# Patient Record
Sex: Male | Born: 1980 | Race: Black or African American | Hispanic: No | Marital: Single | Smoking: Current every day smoker
Health system: Southern US, Community
[De-identification: ages and names within clinical notes are randomized; demographics above are authoritative.]

---

## 2017-09-29 ENCOUNTER — Encounter (HOSPITAL_COMMUNITY): Payer: Self-pay | Admitting: Emergency Medicine

## 2017-09-29 ENCOUNTER — Ambulatory Visit (HOSPITAL_COMMUNITY)
Admission: EM | Admit: 2017-09-29 | Discharge: 2017-09-29 | Disposition: A | Discharge status: Home / Self Care | Payer: Self-pay | Attending: Family Medicine | Admitting: Family Medicine

## 2017-09-29 ENCOUNTER — Ambulatory Visit (INDEPENDENT_AMBULATORY_CARE_PROVIDER_SITE_OTHER): Payer: Self-pay

## 2017-09-29 DIAGNOSIS — M79642 Pain in left hand: Secondary | ICD-10-CM

## 2017-09-29 DIAGNOSIS — S63653A Sprain of metacarpophalangeal joint of left middle finger, initial encounter: Secondary | ICD-10-CM

## 2017-09-29 DIAGNOSIS — X500XXA Overexertion from strenuous movement or load, initial encounter: Secondary | ICD-10-CM

## 2017-09-29 MED ORDER — DICLOFENAC SODIUM 75 MG PO TBEC: 75.0000 mg | DELAYED_RELEASE_TABLET | Freq: Two times a day (BID) | ORAL | 0 refills | Status: DC

## 2017-09-29 NOTE — ED Provider Notes (Signed)
[facility]    CSN: [id number] Arrival date & time: 09/29/17  1457     History   Chief Complaint Chief Complaint  Patient presents with  . Hand Pain    HPI Berenda MoraleWendell Neil Gatchell is a 37 y.o. male.   Pt states he was lifting something with his L hand this morning, states "it was maybe 8 pounds at the most". States he felt a pop in his hand and now its painful to bend his fingers or to make a fist.   Patient works as a Midwifebutcher.  Pain is located over the M CP joint of his dorsal middle finger on the left.     History reviewed. No pertinent past medical history.  There are no active problems to display for this patient.   History reviewed. No pertinent surgical history.     Home Medications    Prior to Admission medications   Medication Sig Start Date End Date Taking? Authorizing Provider  diclofenac (VOLTAREN) 75 MG EC tablet Take 1 tablet (75 mg total) by mouth 2 (two) times daily. 09/29/17   Elvina SidleLauenstein, Kasiyah Platter, MD    Family History Family History  Problem Relation Age of Onset  . Healthy Mother   . Healthy Father     Social History Social History   Tobacco Use  . Smoking status: Current Every Day Smoker  Substance Use Topics  . Alcohol use: Yes  . Drug use: Never     Allergies   Patient has no known allergies.   Review of Systems Review of Systems  Constitutional: Negative.   HENT: Negative.   Respiratory: Negative.   Musculoskeletal: Negative for joint swelling.  Neurological: Negative.   Psychiatric/Behavioral: Negative.      Physical Exam Triage Vital Signs ED Triage Vitals [09/29/17 1515]  Enc Vitals Group     BP 109/73     Pulse Rate 65     Resp 18     Temp (!) 97.5 F (36.4 C)     Temp Source Oral     SpO2 99 %     Weight      Height      Head Circumference      Peak Flow      Pain Score      Pain Loc      Pain Edu?      Excl. in GC?    No data found.  Updated Vital Signs BP 109/73   Pulse 65   Temp (!)  97.5 F (36.4 C) (Oral)   Resp 18   SpO2 99%   Visual Acuity Right Eye Distance:   Left Eye Distance:   Bilateral Distance:    Right Eye Near:   Left Eye Near:    Bilateral Near:     Physical Exam  Constitutional: He is oriented to person, place, and time. He appears well-developed and well-nourished.  HENT:  Head: Normocephalic and atraumatic.  Right Ear: External ear normal.  Left Ear: External ear normal.  Eyes: Pupils are equal, round, and reactive to light. Conjunctivae are normal.  Neck: Normal range of motion. Neck supple.  Pulmonary/Chest: Effort normal.  Musculoskeletal:  Able to make fist slowly with left hand.  Tender over dorsal MCP joint of left third finger.  No swelling.  Right handed  Left wrist and other fingers are normal re:  ROM and appearance  Neurological: He is alert and oriented to person, place, and time. No sensory deficit.  Skin: Skin is  warm and dry.  Nursing note and vitals reviewed.    UC Treatments / Results  Labs (all labs ordered are listed, but only abnormal results are displayed) Labs Reviewed - No data to display  EKG None  Radiology Dg Hand Complete Left  Result Date: 09/29/2017 CLINICAL DATA:  Sharp pain in the left hand EXAM: LEFT HAND - COMPLETE 3+ VIEW COMPARISON:  None. FINDINGS: There is no evidence of fracture or dislocation. There is no evidence of arthropathy or other focal bone abnormality. Soft tissues are unremarkable. IMPRESSION: Negative. Electronically Signed   By: Jasmine PangKim  Fujinaga M.D.   On: 09/29/2017 15:57    Procedures Procedures (including critical care time)  Medications Ordered in UC Medications - No data to display  Initial Impression / Assessment and Plan / UC Course  I have reviewed the triage vital signs and the nursing notes.  Pertinent labs & imaging results that were available during my care of the patient were reviewed by me and considered in my medical decision making (see chart for  details).     Final Clinical Impressions(s) / UC Diagnoses   Final diagnoses:  Sprain of metacarpophalangeal (MCP) joint of left middle finger, initial encounter     Discharge Instructions     There is no evidence of fracture or dislocation.    ED Prescriptions    Medication Sig Dispense Auth. Provider   diclofenac (VOLTAREN) 75 MG EC tablet Take 1 tablet (75 mg total) by mouth 2 (two) times daily. 14 tablet Elvina SidleLauenstein, Roberta Kelly, MD     Controlled Substance Prescriptions [place] Controlled Substance Registry consulted? Not Applicable   Elvina SidleLauenstein, Fanchon Papania, MD 09/29/17 [id number]

## 2017-09-29 NOTE — ED Triage Notes (Signed)
Pt states he was lifting something with his L hand this morning, states "it was maybe 8 pounds at the most". States he felt a pop in his hand and now its painful to bend his fingers or to make a fist.

## 2017-09-29 NOTE — Discharge Instructions (Addendum)
There is no evidence of fracture or dislocation.  Keep finger taped to the ring finger for a week.

## 2018-03-03 ENCOUNTER — Ambulatory Visit (HOSPITAL_COMMUNITY)
Admission: EM | Admit: 2018-03-03 | Discharge: 2018-03-03 | Disposition: A | Discharge status: Home / Self Care | Payer: Self-pay | Attending: Family Medicine | Admitting: Family Medicine

## 2018-03-03 ENCOUNTER — Other Ambulatory Visit: Payer: Self-pay

## 2018-03-03 ENCOUNTER — Ambulatory Visit (INDEPENDENT_AMBULATORY_CARE_PROVIDER_SITE_OTHER): Payer: Self-pay

## 2018-03-03 ENCOUNTER — Encounter (HOSPITAL_COMMUNITY): Payer: Self-pay | Admitting: Emergency Medicine

## 2018-03-03 DIAGNOSIS — S93402A Sprain of unspecified ligament of left ankle, initial encounter: Secondary | ICD-10-CM

## 2018-03-03 MED ORDER — IBUPROFEN 800 MG PO TABS: 800.0000 mg | ORAL_TABLET | Freq: Three times a day (TID) | ORAL | 0 refills | Status: AC

## 2018-03-03 NOTE — ED Provider Notes (Signed)
[facility]    CSN: [id number] Arrival date & time: 03/03/18  1735     History   Chief Complaint Chief Complaint  Patient presents with  . Foot Pain    HPI Tim Webster is a 38 y.o. male.   HPI  Patient states he stepped off a curb at home and twisted his ankle.  Larey Seat to the ground.  Had trouble walking on it at first but now can put limited weight on his left ankle.  Minimal swelling.  Pain with range of motion.  Pain with weightbearing.  No prior fracture. History reviewed. No pertinent past medical history.  There are no active problems to display for this patient.   History reviewed. No pertinent surgical history.     Home Medications    Prior to Admission medications   Medication Sig Start Date End Date Taking? Authorizing Provider  ibuprofen (ADVIL,MOTRIN) 800 MG tablet Take 1 tablet (800 mg total) by mouth 3 (three) times daily. 03/03/18   Eustace Moore, MD    Family History Family History  Problem Relation Age of Onset  . Healthy Mother   . Healthy Father    Patient states that his family is healthy with no cancer, heart disease Social History Social History   Tobacco Use  . Smoking status: Current Every Day Smoker  Substance Use Topics  . Alcohol use: Yes  . Drug use: Never     Allergies   Patient has no known allergies.   Review of Systems Review of Systems  Constitutional: Negative for chills and fever.  HENT: Negative for ear pain and sore throat.   Eyes: Negative for pain and visual disturbance.  Respiratory: Negative for cough and shortness of breath.   Cardiovascular: Negative for chest pain and palpitations.  Gastrointestinal: Negative for abdominal pain and vomiting.  Genitourinary: Negative for dysuria and hematuria.  Musculoskeletal: Positive for arthralgias and gait problem. Negative for back pain.  Skin: Negative for color change and rash.  Neurological: Negative for seizures and syncope.  All other  systems reviewed and are negative.    Physical Exam Triage Vital Signs ED Triage Vitals  Enc Vitals Group     BP 03/03/18 1900 127/66     Pulse Rate 03/03/18 1900 (!) 52     Resp 03/03/18 1900 16     Temp 03/03/18 1900 98.8 F (37.1 C)     Temp Source 03/03/18 1900 Oral     SpO2 03/03/18 1900 100 %     Weight --      Height --      Head Circumference --      Peak Flow --      Pain Score 03/03/18 1858 8     Pain Loc --      Pain Edu? --      Excl. in GC? --    No data found.  Updated Vital Signs BP 127/66 (BP Location: Left Arm)   Pulse (!) 52   Temp 98.8 F (37.1 C) (Oral)   Resp 16   SpO2 100%   Visual Acuity Right Eye Distance:   Left Eye Distance:   Bilateral Distance:    Right Eye Near:   Left Eye Near:    Bilateral Near:     Physical Exam Constitutional:      Appearance: He is well-developed and normal weight.  HENT:     Head: Normocephalic and atraumatic.  Eyes:     Conjunctiva/sclera: Conjunctivae normal.  Pupils: Pupils are equal, round, and reactive to light.  Neck:     Musculoskeletal: Normal range of motion.  Cardiovascular:     Rate and Rhythm: Normal rate.  Pulmonary:     Effort: Pulmonary effort is normal. No respiratory distress.  Abdominal:     General: There is no distension.     Palpations: Abdomen is soft.  Musculoskeletal: Normal range of motion.     Comments: Ankle has minimal soft tissue swelling.  Tenderness over anterior ankle, tenderness over lateral fifth metatarsal, mild tenderness in the arch and medial foot.  Good range of motion.  No instability.  Good pulses and cap refill  Skin:    General: Skin is warm and dry.  Neurological:     General: No focal deficit present.     Mental Status: He is alert. Mental status is at baseline.  Psychiatric:        Mood and Affect: Mood normal.      UC Treatments / Results  Labs (all labs ordered are listed, but only abnormal results are displayed) Labs Reviewed - No data to  display  EKG None  Radiology Dg Ankle Complete Left  Result Date: 03/03/2018 CLINICAL DATA:  Stepped wrong off of a curb onto road injuring his LEFT foot and ankle, medial and lateral pain EXAM: LEFT ANKLE COMPLETE - 3+ VIEW COMPARISON:  None FINDINGS: Osseous mineralization normal. Joint spaces preserved. No acute fracture, dislocation, or bone destruction. IMPRESSION: Normal exam. Electronically Signed   By: Ulyses SouthwardMark  Boles M.D.   On: 03/03/2018 19:43   Dg Foot Complete Left  Result Date: 03/03/2018 CLINICAL DATA:  Stepped wrong off of a curb onto road injuring his LEFT foot and ankle, medial and lateral pain EXAM: LEFT FOOT - COMPLETE 3+ VIEW COMPARISON:  None FINDINGS: Osseous mineralization normal. Joint spaces preserved. No acute fracture, dislocation, or bone destruction. IMPRESSION: Normal exam. Electronically Signed   By: Ulyses SouthwardMark  Boles M.D.   On: 03/03/2018 19:41    Procedures Procedures (including critical care time)  Medications Ordered in UC Medications - No data to display  Initial Impression / Assessment and Plan / UC Course  I have reviewed the triage vital signs and the nursing notes.  Pertinent labs & imaging results that were available during my care of the patient were reviewed by me and considered in my medical decision making (see chart for details).     I reviewed with him he has an ankle sprain.  Indications for brace.  Avoid standing and walking.  Avoid sports.  Range of motion exercises demonstrated.  Return if fails to improve over the next couple of weeks. Final Clinical Impressions(s) / UC Diagnoses   Final diagnoses:  Sprain of left ankle, unspecified ligament, initial encounter     Discharge Instructions     Ice Rest  Brace for comfort Li it prolonged standing and walking Ibuprofen for pain Return as needed    ED Prescriptions    Medication Sig Dispense Auth. Provider   ibuprofen (ADVIL,MOTRIN) 800 MG tablet Take 1 tablet (800 mg total) by  mouth 3 (three) times daily. 21 tablet Eustace MooreNelson, Conrad Zajkowski Sue, MD     Controlled Substance Prescriptions [place] Controlled Substance Registry consulted? Not Applicable   Eustace MooreNelson, Theresea Trautmann Sue, MD 03/03/18 2048

## 2018-03-03 NOTE — ED Notes (Signed)
Patient able to ambulate independently  

## 2018-03-03 NOTE — ED Triage Notes (Signed)
The patient presented to the [facility] with a complaint of left foot pain and swelling secondary to rolling or twisting his foot stepping off of a curb earlier today.

## 2018-03-03 NOTE — Discharge Instructions (Addendum)
Ice Rest  Brace for comfort Tim Webster it prolonged standing and walking Ibuprofen for pain Return as needed

## 2020-06-17 IMAGING — DX DG ANKLE COMPLETE 3+V*L*
3 series · 3 of 3 positions shown · non-contrast
Comparison: None

CLINICAL DATA: Stepped wrong off of a curb onto road injuring his
LEFT foot and ankle, medial and lateral pain

EXAM:
LEFT ANKLE COMPLETE - 3+ VIEW

[ankle ap]
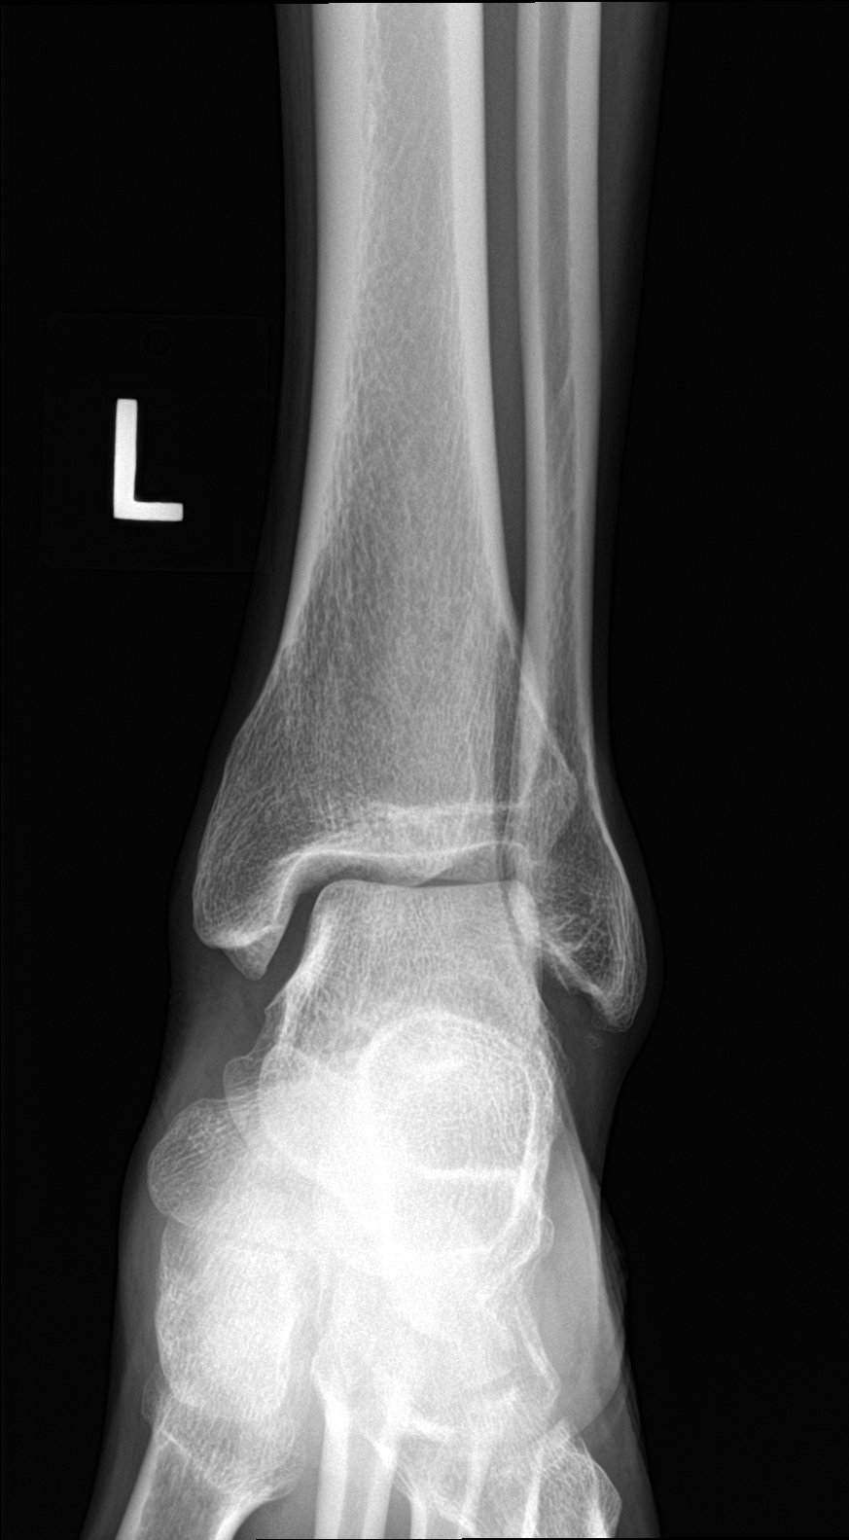

[ankle obl]
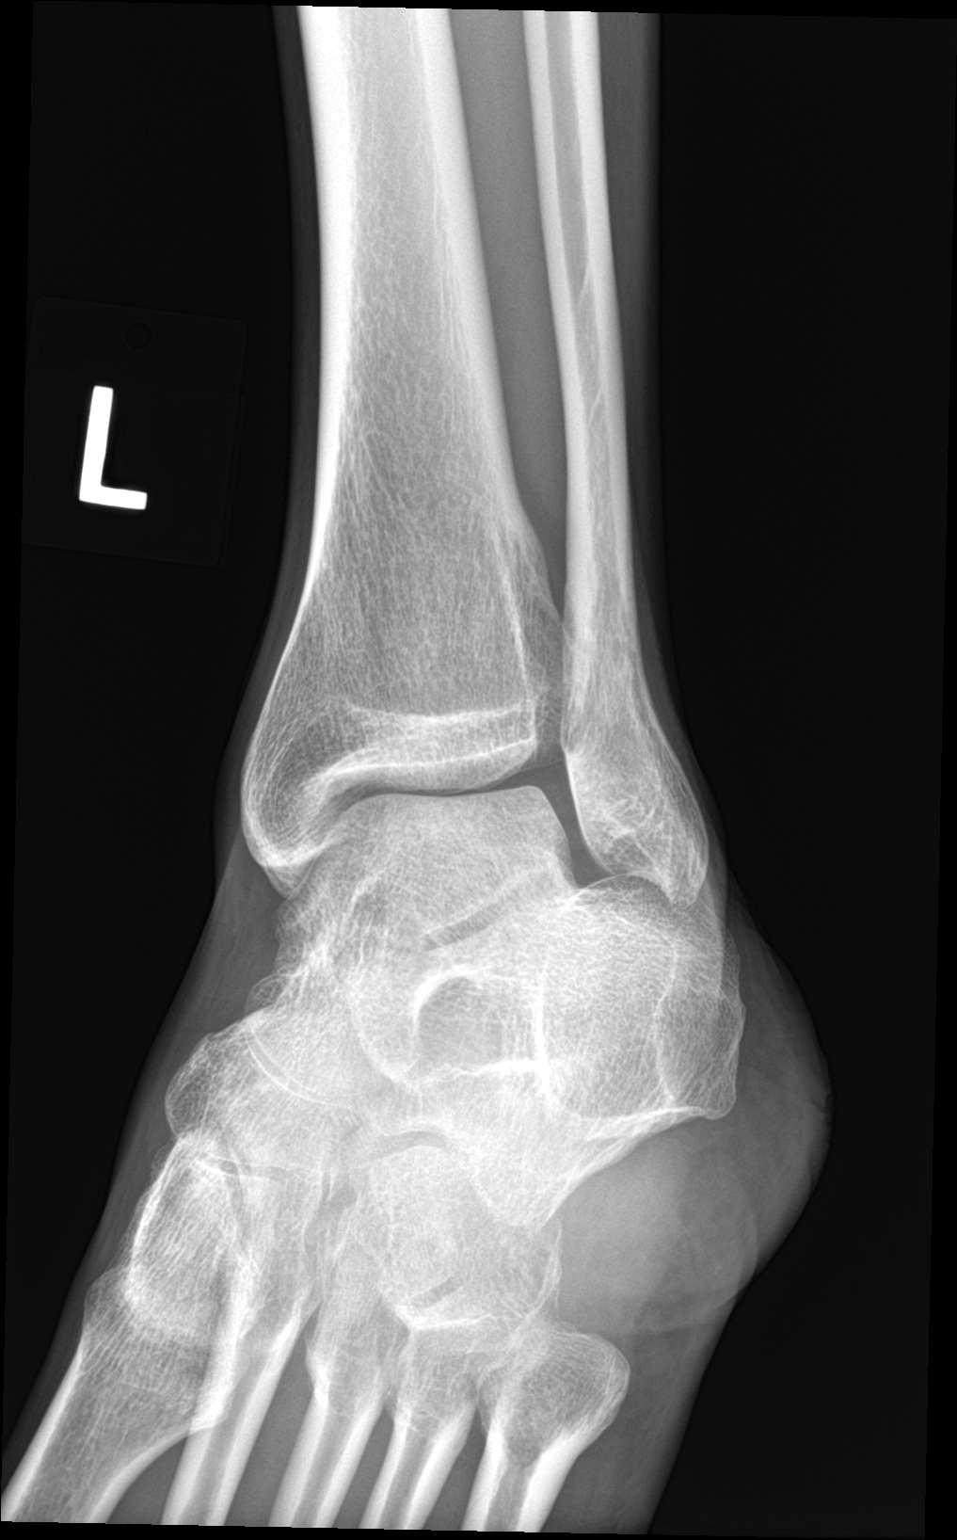

[ankle lat]
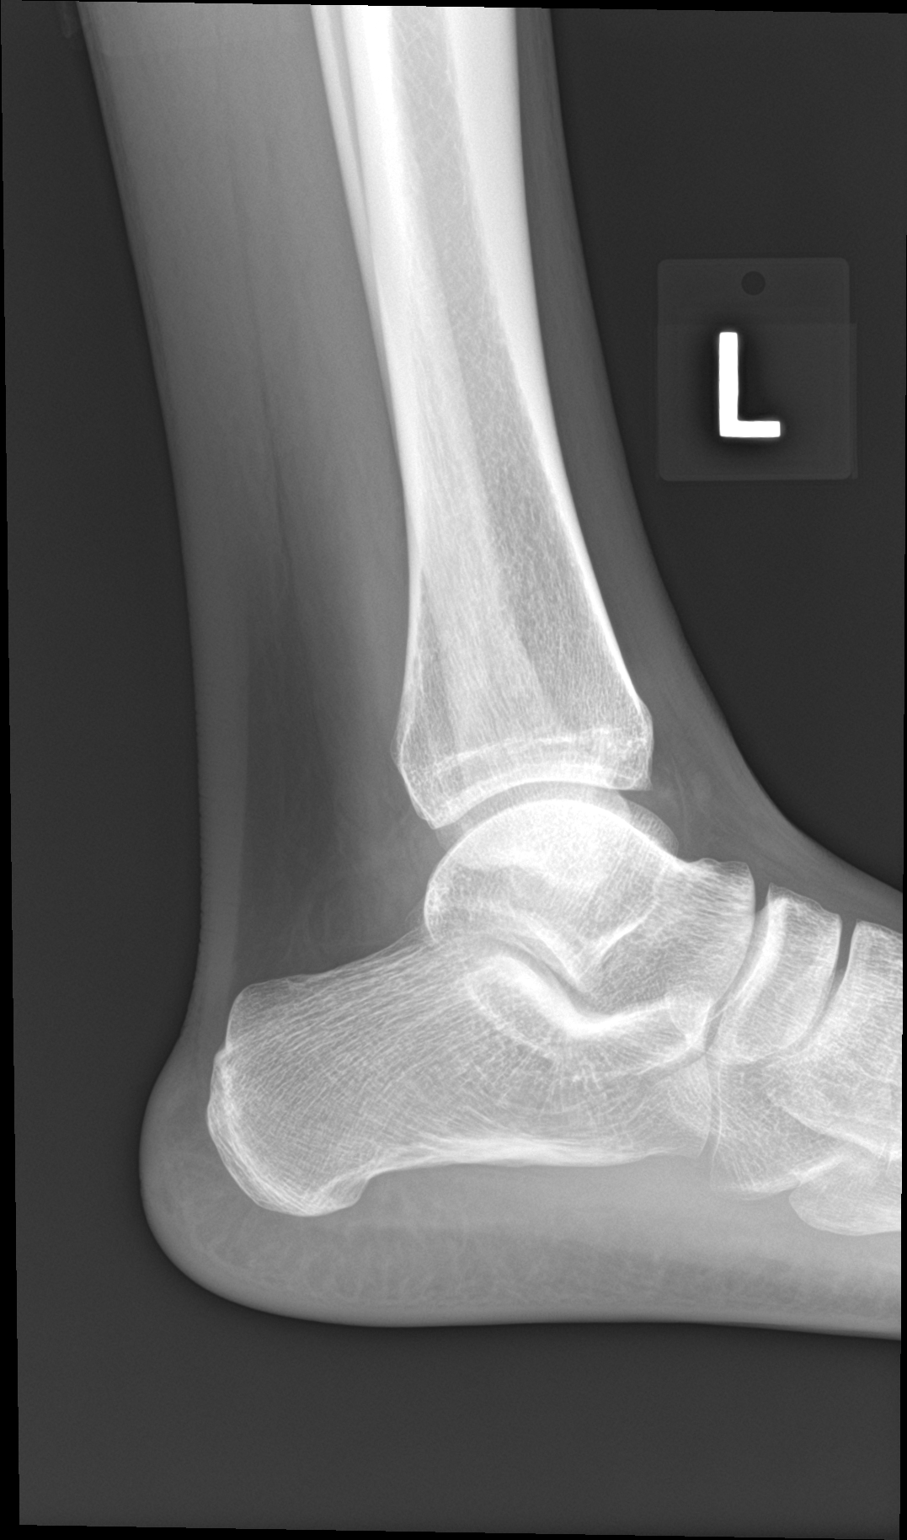

[3 of 3 positions shown; findings below may reference images not displayed]

FINDINGS: Osseous mineralization normal.

Joint spaces preserved.

No acute fracture, dislocation, or bone destruction.
IMPRESSION: Normal exam.
